# Patient Record
Sex: Female | Born: 1965 | Race: White | Hispanic: No | Marital: Married
Health system: Southern US, Community
[De-identification: ages and names within clinical notes are randomized; demographics above are authoritative.]

## PROBLEM LIST (undated history)

## (undated) DIAGNOSIS — J80 Acute respiratory distress syndrome: Secondary | ICD-10-CM

## (undated) DIAGNOSIS — J9621 Acute and chronic respiratory failure with hypoxia: Secondary | ICD-10-CM

## (undated) DIAGNOSIS — U071 COVID-19: Secondary | ICD-10-CM

## (undated) DIAGNOSIS — Z930 Tracheostomy status: Secondary | ICD-10-CM

---

## 2020-02-02 ENCOUNTER — Inpatient Hospital Stay
Admission: RE | Admit: 2020-02-02 | Discharge: 2020-02-10 | Disposition: A | Discharge status: Home Health | Payer: BC Managed Care – PPO

## 2020-02-02 DIAGNOSIS — U071 COVID-19: Secondary | ICD-10-CM | POA: Diagnosis present

## 2020-02-02 DIAGNOSIS — Z931 Gastrostomy status: Secondary | ICD-10-CM

## 2020-02-02 DIAGNOSIS — Z930 Tracheostomy status: Secondary | ICD-10-CM

## 2020-02-02 DIAGNOSIS — R0602 Shortness of breath: Secondary | ICD-10-CM

## 2020-02-02 DIAGNOSIS — J9621 Acute and chronic respiratory failure with hypoxia: Secondary | ICD-10-CM | POA: Diagnosis present

## 2020-02-02 DIAGNOSIS — J9690 Respiratory failure, unspecified, unspecified whether with hypoxia or hypercapnia: Secondary | ICD-10-CM

## 2020-02-02 HISTORY — DX: Acute and chronic respiratory failure with hypoxia: J96.21

## 2020-02-02 HISTORY — DX: Acute respiratory distress syndrome: J80

## 2020-02-02 HISTORY — DX: COVID-19: U07.1

## 2020-02-02 HISTORY — DX: Tracheostomy status: Z93.0

## 2020-02-03 ENCOUNTER — Other Ambulatory Visit (HOSPITAL_COMMUNITY): Payer: BC Managed Care – PPO

## 2020-02-03 DIAGNOSIS — U071 COVID-19: Secondary | ICD-10-CM

## 2020-02-03 DIAGNOSIS — J9621 Acute and chronic respiratory failure with hypoxia: Secondary | ICD-10-CM

## 2020-02-03 DIAGNOSIS — Z930 Tracheostomy status: Secondary | ICD-10-CM | POA: Diagnosis not present

## 2020-02-03 DIAGNOSIS — J80 Acute respiratory distress syndrome: Secondary | ICD-10-CM

## 2020-02-03 LAB — COMPREHENSIVE METABOLIC PANEL
ALT: 37 U/L (ref 0–44)
AST: 32 U/L (ref 15–41)
Albumin: 2.7 g/dL — ABNORMAL LOW (ref 3.5–5.0)
Alkaline Phosphatase: 66 U/L (ref 38–126)
Anion gap: 9 (ref 5–15)
BUN: <5 mg/dL — ABNORMAL LOW (ref 6–20)
CO2: 28 mmol/L (ref 22–32)
Calcium: 9 mg/dL (ref 8.9–10.3)
Chloride: 103 mmol/L (ref 98–111)
Creatinine, Ser: 0.5 mg/dL (ref 0.44–1.00)
GFR calc Af Amer: >60 mL/min (ref >60)
GFR calc non Af Amer: >60 mL/min (ref >60)
Glucose, Bld: 144 mg/dL — ABNORMAL HIGH (ref 70–99)
Potassium: 3.3 mmol/L — ABNORMAL LOW (ref 3.5–5.1)
Sodium: 140 mmol/L (ref 135–145)
Total Bilirubin: 0.5 mg/dL (ref 0.3–1.2)
Total Protein: 6 g/dL — ABNORMAL LOW (ref 6.5–8.1)

## 2020-02-03 LAB — CBC
HCT: 33.4 % — ABNORMAL LOW (ref 36.0–46.0)
Hemoglobin: 10.4 g/dL — ABNORMAL LOW (ref 12.0–15.0)
MCH: 27.9 pg (ref 26.0–34.0)
MCHC: 31.1 g/dL (ref 30.0–36.0)
MCV: 89.5 fL (ref 80.0–100.0)
Platelets: 324 10*3/uL (ref 150–400)
RBC: 3.73 MIL/uL — ABNORMAL LOW (ref 3.87–5.11)
RDW: 15.4 % (ref 11.5–15.5)
WBC: 8.3 10*3/uL (ref 4.0–10.5)
nRBC: 0 % (ref 0.0–0.2)

## 2020-02-03 LAB — VANCOMYCIN, RANDOM: Vancomycin Rm: 6

## 2020-02-03 NOTE — Consult Note (Signed)
Pulmonary Critical Care Medicine [facility] [place]  PULMONARY SERVICE  Date of Service: 02/03/2020  PULMONARY CRITICAL CARE CONSULT   Desiree Curtis  MPN:[id number]  DOB: [birth year]   DOA: 02/02/2020  Referring Physician: Carron Curie, MD  HPI: Desiree Curtis is a 54 y.o. female seen for follow up of Acute on Chronic Respiratory Failure.  Patient has multiple medical problems including hypertension hyperlipidemia type 2 diabetes GERD admitted to the hospital because of increasing shortness of breath.  Patient has been diagnosed with COVID-19 virus infection and COVID-19 pneumonia.  At the time of presentation patient was febrile and had saturations of 83% on room air.  Also was noted to have an elevated CPK and negative troponin.  Patient was admitted to the hospital for further evaluation.  Patient and decline in status and that is intubated on mechanical ventilation.  Subsequently was not able to come off of the ventilator.patient ended up having to have a tracheostomy.   Review of Systems:  ROS performed and is unremarkable other than noted above.  Past Medical History:  Diagnosis Date  . Depression  . Generalized anxiety disorder  . GERD (gastroesophageal reflux disease)  . Hypercholesteremia  . Hypertension  . Type 2 diabetes mellitus (CMS/HCC) (HCC)  . Vitamin D deficiency   Past medical history has been independently reviewed by me.  Past Surgical History:   Past Surgical History:  Procedure Laterality Date  . CESAREAN SECTION, CLASSIC  . CHOLECYSTECTOMY  . ENDOMETRIAL ABLATION 2006  . KIDNEY STONE SURGERY  . TUBAL LIGATION   Allergies  Allergen Reactions  . Black Dye GI intolerance  . Blue Dye GI intolerance  . Lorcet (Hydrocodone) [Hydrocodone-Acetaminophen] Other (see comments)  . Purple Dye GI intolerance  . Shrimp GI intolerance  . Sulfa (Sulfonamide Antibiotics) GI intolerance  . Red Dye GI intolerance   Allergies are independently  reviewed by me.  Social History:   reports that she has never smoked. She has never used smokeless tobacco. She reports previous alcohol use. She reports that she does not use drugs.  Social history has been independently reviewed by me.  Family History:   family history includes Diabetes in her mother and sister; Heart disease in her mother and sister; Hypertension in her mother and sister; Stroke in her maternal grandmother.     Medications: Reviewed on Rounds  Physical Exam:  Vitals: Temperature is 97.7 pulse 76 respiratory 26 blood pressure is 112/72  Ventilator Settings off the ventilator on T collar room air  . General: Comfortable at this time . Eyes: Grossly normal lids, irises & conjunctiva . ENT: grossly tongue is normal . Neck: no obvious mass . Cardiovascular: S1-S2 normal no gallop or rub . Respiratory: Scattered rhonchi coarse breath sounds . Abdomen: Soft and nontender . Skin: no rash seen on limited exam . Musculoskeletal: not rigid . Psychiatric:unable to assess . Neurologic: no seizure no involuntary movements         Labs on Admission:  Basic Metabolic Panel: Recent Labs  Lab 02/03/20 0754  NA 140  K 3.3*  CL 103  CO2 28  GLUCOSE 144*  BUN <5*  CREATININE 0.50  CALCIUM 9.0    No results for input(s): PHART, PCO2ART, PO2ART, HCO3, O2SAT in the last 168 hours.  Liver Function Tests: Recent Labs  Lab 02/03/20 0754  AST 32  ALT 37  ALKPHOS 66  BILITOT 0.5  PROT 6.0*  ALBUMIN 2.7*   No results for input(s): LIPASE, AMYLASE in the last  168 hours. No results for input(s): AMMONIA in the last 168 hours.  CBC: Recent Labs  Lab 02/03/20 0754  WBC 8.3  HGB 10.4*  HCT 33.4*  MCV 89.5  PLT 324    Cardiac Enzymes: No results for input(s): CKTOTAL, CKMB, CKMBINDEX, TROPONINI in the last 168 hours.  BNP (last 3 results) No results for input(s): BNP in the last 8760 hours.  ProBNP (last 3 results) No results for input(s): PROBNP  in the last 8760 hours.   Radiological Exams on Admission: DG ABDOMEN PEG TUBE LOCATION  Result Date: 02/03/2020 CLINICAL DATA:  54 year old female with respiratory failure, PEG tube. EXAM: ABDOMEN - 1 VIEW COMPARISON:  Portable chest today reported separately. FINDINGS: Portable AP supine view at 0636 hours. Injected contrast outlines the gastric mucosa as expected, as well as the proximal duodenum. Peg tube balloon projects over the stomach in the midline. No abnormal contrast accumulation. Small volume of retained oral contrast also in the large bowel, revealing diverticulosis at the hepatic flexure. Non obstructed bowel gas pattern. Cholecystectomy clips in the right upper quadrant. No acute osseous abnormality identified. CONTRAST:  50 mL of Omnipaque 300 IMPRESSION: 1. Peg tube contrast injection with no adverse features. 2. Small volume of retained contrast in the large bowel, revealing hepatic flexure diverticulosis. Electronically Signed   By: Odessa Fleming M.D.   On: 02/03/2020 07:03   DG CHEST PORT 1 VIEW  Result Date: 02/03/2020 CLINICAL DATA:  Respiratory failure.  Peg tube placement. EXAM: PORTABLE CHEST 1 VIEW COMPARISON:  No prior. FINDINGS: Tracheostomy tube noted good anatomic position. Heart size normal. Low lung volumes with bibasilar atelectasis/infiltrates. No pleural effusion or pneumothorax. No acute bony abnormality. IMPRESSION: 1. Tracheostomy tube noted in good anatomic position. 2. Low lung volumes with bibasilar atelectasis/infiltrates. Electronically Signed   By: Maisie Fus  Register   On: 02/03/2020 07:01    Assessment/Plan Active Problems:   Acute on chronic respiratory failure with hypoxia (HCC)   COVID-19 virus infection   Acute respiratory distress syndrome (ARDS) due to COVID-19 virus (HCC)   Tracheostomy status (HCC)   1. Acute on chronic respiratory failure with hypoxia she is currently on T collar has been on room air doing quite well.  She should be ready for PMV  and capping.  We will continue with advancing however she has had some upper airway issues and edema so therefore ENT might be needed to see to the patient prior to considering decannulation. 2. COVID-19 virus infection has been in recovery phase we will continue to monitor along. 3. ARDS slow improvement we will continue to follow the chest x-ray the current films actually look quite good 4. Tracheostomy status we will need to evaluate the upper airway prior to considering decannulation patient was noted to have laryngeal edema at the other facility.  I have personally seen and evaluated the patient, evaluated laboratory and imaging results, formulated the assessment and plan and placed orders. The Patient requires high complexity decision making with multiple systems involvement.  Case was discussed on Rounds with the Respiratory Therapy Director and the Respiratory staff Time Spent  Yevonne Pax, MD [facility] Pulmonary Critical Care Medicine Sleep Medicine

## 2020-02-04 DIAGNOSIS — R0602 Shortness of breath: Secondary | ICD-10-CM | POA: Diagnosis not present

## 2020-02-04 DIAGNOSIS — Z930 Tracheostomy status: Secondary | ICD-10-CM | POA: Diagnosis not present

## 2020-02-04 DIAGNOSIS — U071 COVID-19: Secondary | ICD-10-CM | POA: Diagnosis not present

## 2020-02-04 DIAGNOSIS — J9621 Acute and chronic respiratory failure with hypoxia: Secondary | ICD-10-CM | POA: Diagnosis not present

## 2020-02-04 NOTE — Progress Notes (Addendum)
Pulmonary Critical Care Medicine [facility] [place]   PULMONARY CRITICAL CARE SERVICE  PROGRESS NOTE  Date of Service: 02/04/2020  Desiree Curtis  QMG:[id number]  DOB: 1966-02-10   DOA: 02/02/2020  Referring Physician: Carron Curie, MD  HPI: Desiree Curtis is a 54 y.o. female seen for follow up of Acute on Chronic Respiratory Failure. Patient remains on 21% aerosol trach collar using PMV satting well this time no distress.  Medications: Reviewed on Rounds  Physical Exam:  Vitals: Pulse 81 respirations 18 BP 135/77 O2 sat 95% temp 98.7  Ventilator Settings ATC 21%  . General: Comfortable at this time . Eyes: Grossly normal lids, irises & conjunctiva . ENT: grossly tongue is normal . Neck: no obvious mass . Cardiovascular: S1 S2 normal no gallop . Respiratory: Coarse breath sounds . Abdomen: soft . Skin: no rash seen on limited exam . Musculoskeletal: not rigid . Psychiatric:unable to assess . Neurologic: no seizure no involuntary movements         Lab Data:   Basic Metabolic Panel: Recent Labs  Lab 02/03/20 0754  NA 140  K 3.3*  CL 103  CO2 28  GLUCOSE 144*  BUN <5*  CREATININE 0.50  CALCIUM 9.0    ABG: No results for input(s): PHART, PCO2ART, PO2ART, HCO3, O2SAT in the last 168 hours.  Liver Function Tests: Recent Labs  Lab 02/03/20 0754  AST 32  ALT 37  ALKPHOS 66  BILITOT 0.5  PROT 6.0*  ALBUMIN 2.7*   No results for input(s): LIPASE, AMYLASE in the last 168 hours. No results for input(s): AMMONIA in the last 168 hours.  CBC: Recent Labs  Lab 02/03/20 0754  WBC 8.3  HGB 10.4*  HCT 33.4*  MCV 89.5  PLT 324    Cardiac Enzymes: No results for input(s): CKTOTAL, CKMB, CKMBINDEX, TROPONINI in the last 168 hours.  BNP (last 3 results) No results for input(s): BNP in the last 8760 hours.  ProBNP (last 3 results) No results for input(s): PROBNP in the last 8760 hours.  Radiological Exams: DG ABDOMEN PEG TUBE  LOCATION  Result Date: 02/03/2020 CLINICAL DATA:  54 year old female with respiratory failure, PEG tube. EXAM: ABDOMEN - 1 VIEW COMPARISON:  Portable chest today reported separately. FINDINGS: Portable AP supine view at 0636 hours. Injected contrast outlines the gastric mucosa as expected, as well as the proximal duodenum. Peg tube balloon projects over the stomach in the midline. No abnormal contrast accumulation. Small volume of retained oral contrast also in the large bowel, revealing diverticulosis at the hepatic flexure. Non obstructed bowel gas pattern. Cholecystectomy clips in the right upper quadrant. No acute osseous abnormality identified. CONTRAST:  50 mL of Omnipaque 300 IMPRESSION: 1. Peg tube contrast injection with no adverse features. 2. Small volume of retained contrast in the large bowel, revealing hepatic flexure diverticulosis. Electronically Signed   By: Odessa Fleming M.D.   On: 02/03/2020 07:03   DG CHEST PORT 1 VIEW  Result Date: 02/03/2020 CLINICAL DATA:  Respiratory failure.  Peg tube placement. EXAM: PORTABLE CHEST 1 VIEW COMPARISON:  No prior. FINDINGS: Tracheostomy tube noted good anatomic position. Heart size normal. Low lung volumes with bibasilar atelectasis/infiltrates. No pleural effusion or pneumothorax. No acute bony abnormality. IMPRESSION: 1. Tracheostomy tube noted in good anatomic position. 2. Low lung volumes with bibasilar atelectasis/infiltrates. Electronically Signed   By: Maisie Fus  Register   On: 02/03/2020 07:01    Assessment/Plan Active Problems:   * No active hospital problems. *   1. Acute  on chronic respiratory failure with hypoxia patient continues on 21% aerosol trach collar will progress to capping as patient can tolerate.  No decannulation at this time until ENT sees. 2. COVID-19 virus infection has been in recovery phase we will continue to monitor along. 3. ARDS slow improvement we will continue to follow the chest x-ray the current films actually look  quite good 4. Tracheostomy status we will need to evaluate the upper airway prior to considering decannulation patient was noted to have laryngeal edema at the other facility.   I have personally seen and evaluated the patient, evaluated laboratory and imaging results, formulated the assessment and plan and placed orders. The Patient requires high complexity decision making with multiple systems involvement.  Rounds were done with the Respiratory Therapy Director and Staff therapists and discussed with nursing staff also.  Yevonne Pax, MD [facility] Pulmonary Critical Care Medicine Sleep Medicine

## 2020-02-05 ENCOUNTER — Other Ambulatory Visit (HOSPITAL_COMMUNITY): Payer: BC Managed Care – PPO

## 2020-02-05 DIAGNOSIS — R0602 Shortness of breath: Secondary | ICD-10-CM | POA: Diagnosis not present

## 2020-02-05 DIAGNOSIS — J9621 Acute and chronic respiratory failure with hypoxia: Secondary | ICD-10-CM | POA: Diagnosis not present

## 2020-02-05 DIAGNOSIS — Z930 Tracheostomy status: Secondary | ICD-10-CM | POA: Diagnosis not present

## 2020-02-05 DIAGNOSIS — U071 COVID-19: Secondary | ICD-10-CM | POA: Diagnosis not present

## 2020-02-05 LAB — BASIC METABOLIC PANEL
Anion gap: 8 (ref 5–15)
BUN: 7 mg/dL (ref 6–20)
CO2: 25 mmol/L (ref 22–32)
Calcium: 8.8 mg/dL — ABNORMAL LOW (ref 8.9–10.3)
Chloride: 106 mmol/L (ref 98–111)
Creatinine, Ser: 0.45 mg/dL (ref 0.44–1.00)
GFR calc Af Amer: >60 mL/min (ref >60)
GFR calc non Af Amer: >60 mL/min (ref >60)
Glucose, Bld: 106 mg/dL — ABNORMAL HIGH (ref 70–99)
Potassium: 3.5 mmol/L (ref 3.5–5.1)
Sodium: 139 mmol/L (ref 135–145)

## 2020-02-05 LAB — VANCOMYCIN, TROUGH: Vancomycin Tr: 10 ug/mL — ABNORMAL LOW (ref 15–20)

## 2020-02-05 NOTE — Progress Notes (Addendum)
Pulmonary Critical Care Medicine [facility] [place]   PULMONARY CRITICAL CARE SERVICE  PROGRESS NOTE  Date of Service: 02/05/2020  Melita Villalona  YHC:[id number]  DOB: Sep 25, 1965   DOA: 02/02/2020  Referring Physician: Carron Curie, MD  HPI: Desiree Curtis is a 54 y.o. female seen for follow up of Acute on Chronic Respiratory Failure.  Patient continues on 28% aerosol trach collar using PMV with no difficulty satting well no distress.  Medications: Reviewed on Rounds  Physical Exam:  Vitals: Pulse 70 respirations 15 BP 130/70 O2 sat 97% temp 98.0  Ventilator Settings 28% ATC  . General: Comfortable at this time . Eyes: Grossly normal lids, irises & conjunctiva . ENT: grossly tongue is normal . Neck: no obvious mass . Cardiovascular: S1 S2 normal no gallop . Respiratory: No rales or rhonchi noted . Abdomen: soft . Skin: no rash seen on limited exam . Musculoskeletal: not rigid . Psychiatric:unable to assess . Neurologic: no seizure no involuntary movements         Lab Data:   Basic Metabolic Panel: Recent Labs  Lab 02/03/20 0754 02/05/20 0505  NA 140 139  K 3.3* 3.5  CL 103 106  CO2 28 25  GLUCOSE 144* 106*  BUN <5* 7  CREATININE 0.50 0.45  CALCIUM 9.0 8.8*    ABG: No results for input(s): PHART, PCO2ART, PO2ART, HCO3, O2SAT in the last 168 hours.  Liver Function Tests: Recent Labs  Lab 02/03/20 0754  AST 32  ALT 37  ALKPHOS 66  BILITOT 0.5  PROT 6.0*  ALBUMIN 2.7*   No results for input(s): LIPASE, AMYLASE in the last 168 hours. No results for input(s): AMMONIA in the last 168 hours.  CBC: Recent Labs  Lab 02/03/20 0754  WBC 8.3  HGB 10.4*  HCT 33.4*  MCV 89.5  PLT 324    Cardiac Enzymes: No results for input(s): CKTOTAL, CKMB, CKMBINDEX, TROPONINI in the last 168 hours.  BNP (last 3 results) No results for input(s): BNP in the last 8760 hours.  ProBNP (last 3 results) No results for input(s): PROBNP in the last 8760  hours.  Radiological Exams: DG Sniff Test  Result Date: 02/05/2020 CLINICAL DATA:  Short of breath.  Post COVID 19 infection. EXAM: CHEST FLUOROSCOPY TECHNIQUE: Real-time fluoroscopic evaluation of the chest was performed. FLUOROSCOPY TIME:  Fluoroscopy Time:  0 minutes 54 second Radiation Exposure Index (if provided by the fluoroscopic device): Number of Acquired Spot Images: 0 COMPARISON:  Chest 02/03/2020 FINDINGS: Elevated right hemidiaphragm. Both diaphragms move appropriately with deep breathing and sniff. No paradoxical moving of the right diaphragm. Diaphragm excursion is approximately equal bilaterally. IMPRESSION: Elevated right hemidiaphragm without diaphragm paresis. Electronically Signed   By: Marlan Palau M.D.   On: 02/05/2020 09:05    Assessment/Plan Active Problems:   * No active hospital problems. *   1. Acute on chronic respiratory failure with hypoxia we will advance to capping trials as patient is doing well on 21% aerosol trach collar continue supportive measures. 2. COVID-19 virus infection has been in recovery phase we will continue to monitor along. 3. ARDS slow improvement we will continue to follow the chest x-ray the current films actually look quite good 4. Tracheostomy status we will need to evaluate the upper airway prior to considering decannulation patient was noted to have laryngeal edema at the other facility.   I have personally seen and evaluated the patient, evaluated laboratory and imaging results, formulated the assessment and plan and placed orders. The  Patient requires high complexity decision making with multiple systems involvement.  Rounds were done with the Respiratory Therapy Director and Staff therapists and discussed with nursing staff also.  Allyne Gee, MD [facility] Pulmonary Critical Care Medicine Sleep Medicine

## 2020-02-06 DIAGNOSIS — Z930 Tracheostomy status: Secondary | ICD-10-CM | POA: Diagnosis not present

## 2020-02-06 DIAGNOSIS — J9621 Acute and chronic respiratory failure with hypoxia: Secondary | ICD-10-CM | POA: Diagnosis present

## 2020-02-06 DIAGNOSIS — J80 Acute respiratory distress syndrome: Secondary | ICD-10-CM | POA: Diagnosis present

## 2020-02-06 DIAGNOSIS — U071 COVID-19: Secondary | ICD-10-CM | POA: Diagnosis present

## 2020-02-06 DIAGNOSIS — R0602 Shortness of breath: Secondary | ICD-10-CM | POA: Diagnosis not present

## 2020-02-06 NOTE — Progress Notes (Addendum)
Pulmonary Critical Care Medicine [facility] [place]   PULMONARY CRITICAL CARE SERVICE  PROGRESS NOTE  Date of Service: 02/06/2020  Desiree Curtis  XHB:[id number]  DOB: 1966-04-04   DOA: 02/02/2020  Referring Physician: Carron Curie, MD  HPI: Desiree Curtis is a 54 y.o. female seen for follow up of Acute on Chronic Respiratory Failure.  Patient On room air has a 24-hour goal at this time currently satting well no fever distress.  Medications: Reviewed on Rounds  Physical Exam:  Vitals: Pulse 74 respirations 20 BP 121/78 O2 sat 95% temp 98.2  Ventilator Settings not currently on ventilator  . General: Comfortable at this time . Eyes: Grossly normal lids, irises & conjunctiva . ENT: grossly tongue is normal . Neck: no obvious mass . Cardiovascular: S1 S2 normal no gallop . Respiratory: No rales or rhonchi noted . Abdomen: soft . Skin: no rash seen on limited exam . Musculoskeletal: not rigid . Psychiatric:unable to assess . Neurologic: no seizure no involuntary movements         Lab Data:   Basic Metabolic Panel: Recent Labs  Lab 02/03/20 0754 02/05/20 0505  NA 140 139  K 3.3* 3.5  CL 103 106  CO2 28 25  GLUCOSE 144* 106*  BUN <5* 7  CREATININE 0.50 0.45  CALCIUM 9.0 8.8*    ABG: No results for input(s): PHART, PCO2ART, PO2ART, HCO3, O2SAT in the last 168 hours.  Liver Function Tests: Recent Labs  Lab 02/03/20 0754  AST 32  ALT 37  ALKPHOS 66  BILITOT 0.5  PROT 6.0*  ALBUMIN 2.7*   No results for input(s): LIPASE, AMYLASE in the last 168 hours. No results for input(s): AMMONIA in the last 168 hours.  CBC: Recent Labs  Lab 02/03/20 0754  WBC 8.3  HGB 10.4*  HCT 33.4*  MCV 89.5  PLT 324    Cardiac Enzymes: No results for input(s): CKTOTAL, CKMB, CKMBINDEX, TROPONINI in the last 168 hours.  BNP (last 3 results) No results for input(s): BNP in the last 8760 hours.  ProBNP (last 3 results) No results for input(s): PROBNP in  the last 8760 hours.  Radiological Exams: DG Sniff Test  Result Date: 02/05/2020 CLINICAL DATA:  Short of breath.  Post COVID 19 infection. EXAM: CHEST FLUOROSCOPY TECHNIQUE: Real-time fluoroscopic evaluation of the chest was performed. FLUOROSCOPY TIME:  Fluoroscopy Time:  0 minutes 54 second Radiation Exposure Index (if provided by the fluoroscopic device): Number of Acquired Spot Images: 0 COMPARISON:  Chest 02/03/2020 FINDINGS: Elevated right hemidiaphragm. Both diaphragms move appropriately with deep breathing and sniff. No paradoxical moving of the right diaphragm. Diaphragm excursion is approximately equal bilaterally. IMPRESSION: Elevated right hemidiaphragm without diaphragm paresis. Electronically Signed   By: Marlan Palau M.D.   On: 02/05/2020 09:05    Assessment/Plan Active Problems:   Acute on chronic respiratory failure with hypoxia (HCC)   COVID-19 virus infection   Acute respiratory distress syndrome (ARDS) due to COVID-19 virus (HCC)   Tracheostomy status (HCC)   1. Acute on chronic respiratory failure with hypoxia patient is doing well capping at this time on room air has a goal of 24 hours.  Patient has had some issues in the past with upper airway will need ENT evaluation for decannulation in the meantime continue supportive measures and aggressive pulmonary toilet. 2. COVID-19 virus infection has been in recovery phase we will continue to monitor along. 3. ARDS slow improvement we will continue to follow the chest x-ray the current films actually  look quite good 4. Tracheostomy status we will need to evaluate the upper airway prior to considering decannulation patient was noted to have laryngeal edema at the other facility.   I have personally seen and evaluated the patient, evaluated laboratory and imaging results, formulated the assessment and plan and placed orders. The Patient requires high complexity decision making with multiple systems involvement.  Rounds were  done with the Respiratory Therapy Director and Staff therapists and discussed with nursing staff also.  Yevonne Pax, MD [facility] Pulmonary Critical Care Medicine Sleep Medicine

## 2020-02-07 DIAGNOSIS — R0602 Shortness of breath: Secondary | ICD-10-CM | POA: Diagnosis not present

## 2020-02-07 DIAGNOSIS — U071 COVID-19: Secondary | ICD-10-CM | POA: Diagnosis not present

## 2020-02-07 DIAGNOSIS — Z930 Tracheostomy status: Secondary | ICD-10-CM | POA: Diagnosis not present

## 2020-02-07 DIAGNOSIS — J9621 Acute and chronic respiratory failure with hypoxia: Secondary | ICD-10-CM | POA: Diagnosis not present

## 2020-02-07 NOTE — Progress Notes (Signed)
Pulmonary Critical Care Medicine [facility] [place]   PULMONARY CRITICAL CARE SERVICE  PROGRESS NOTE  Date of Service: 02/07/2020  Desiree Curtis  LNL:[id number]  DOB: Sep 18, 1965   DOA: 02/02/2020  Referring Physician: Carron Curie, MD  HPI: Desiree Curtis is a 54 y.o. female seen for follow up of Acute on Chronic Respiratory Failure.  Patient is capping doing well today will be 24 hours on capping trials  Medications: Reviewed on Rounds  Physical Exam:  Vitals: Temperature is 97.5 pulse 61 respiratory 18 blood pressure is 137/76 saturations 95%  Ventilator Settings capping trials on 24-hour goal  . General: Comfortable at this time . Eyes: Grossly normal lids, irises & conjunctiva . ENT: grossly tongue is normal . Neck: no obvious mass . Cardiovascular: S1 S2 normal no gallop . Respiratory: No rhonchi very coarse breath sounds . Abdomen: soft . Skin: no rash seen on limited exam . Musculoskeletal: not rigid . Psychiatric:unable to assess . Neurologic: no seizure no involuntary movements         Lab Data:   Basic Metabolic Panel: Recent Labs  Lab 02/03/20 0754 02/05/20 0505  NA 140 139  K 3.3* 3.5  CL 103 106  CO2 28 25  GLUCOSE 144* 106*  BUN <5* 7  CREATININE 0.50 0.45  CALCIUM 9.0 8.8*    ABG: No results for input(s): PHART, PCO2ART, PO2ART, HCO3, O2SAT in the last 168 hours.  Liver Function Tests: Recent Labs  Lab 02/03/20 0754  AST 32  ALT 37  ALKPHOS 66  BILITOT 0.5  PROT 6.0*  ALBUMIN 2.7*   No results for input(s): LIPASE, AMYLASE in the last 168 hours. No results for input(s): AMMONIA in the last 168 hours.  CBC: Recent Labs  Lab 02/03/20 0754  WBC 8.3  HGB 10.4*  HCT 33.4*  MCV 89.5  PLT 324    Cardiac Enzymes: No results for input(s): CKTOTAL, CKMB, CKMBINDEX, TROPONINI in the last 168 hours.  BNP (last 3 results) No results for input(s): BNP in the last 8760 hours.  ProBNP (last 3 results) No results for  input(s): PROBNP in the last 8760 hours.  Radiological Exams: No results found.  Assessment/Plan Active Problems:   Acute on chronic respiratory failure with hypoxia (HCC)   COVID-19 virus infection   Acute respiratory distress syndrome (ARDS) due to COVID-19 virus (HCC)   Tracheostomy status (HCC)   1. Acute on chronic respiratory failure with hypoxia we will continue with capping trials 2. COVID-19 virus infection in resolution phase 3. ARDS slow improvement 4. Tracheostomy doing well with capping   I have personally seen and evaluated the patient, evaluated laboratory and imaging results, formulated the assessment and plan and placed orders. The Patient requires high complexity decision making with multiple systems involvement.  Rounds were done with the Respiratory Therapy Director and Staff therapists and discussed with nursing staff also.  Yevonne Pax, MD [facility] Pulmonary Critical Care Medicine Sleep Medicine

## 2020-02-08 DIAGNOSIS — U071 COVID-19: Secondary | ICD-10-CM | POA: Diagnosis not present

## 2020-02-08 DIAGNOSIS — R0602 Shortness of breath: Secondary | ICD-10-CM | POA: Diagnosis not present

## 2020-02-08 DIAGNOSIS — Z930 Tracheostomy status: Secondary | ICD-10-CM | POA: Diagnosis not present

## 2020-02-08 DIAGNOSIS — J9621 Acute and chronic respiratory failure with hypoxia: Secondary | ICD-10-CM | POA: Diagnosis not present

## 2020-02-08 LAB — VANCOMYCIN, TROUGH: Vancomycin Tr: 14 ug/mL — ABNORMAL LOW (ref 15–20)

## 2020-02-08 NOTE — Progress Notes (Signed)
Pulmonary Critical Care Medicine [facility] [place]   PULMONARY CRITICAL CARE SERVICE  PROGRESS NOTE  Date of Service: 02/08/2020  Desiree Curtis  GYI:[id number]  DOB: 1965-10-04   DOA: 02/02/2020  Referring Physician: Carron Curie, MD  HPI: Desiree Curtis is a 54 y.o. female seen for follow up of Acute on Chronic Respiratory Failure.  Patient currently is doing well with the capping ready for decannulation  Medications: Reviewed on Rounds  Physical Exam:  Vitals: Temperature 97.0 pulse 74 respiratory 20 blood pressure is 105/60 saturations 97%  Ventilator Settings capping  . General: Comfortable at this time . Eyes: Grossly normal lids, irises & conjunctiva . ENT: grossly tongue is normal . Neck: no obvious mass . Cardiovascular: S1 S2 normal no gallop . Respiratory: No rhonchi no rales . Abdomen: soft . Skin: no rash seen on limited exam . Musculoskeletal: not rigid . Psychiatric:unable to assess . Neurologic: no seizure no involuntary movements         Lab Data:   Basic Metabolic Panel: Recent Labs  Lab 02/03/20 0754 02/05/20 0505  NA 140 139  K 3.3* 3.5  CL 103 106  CO2 28 25  GLUCOSE 144* 106*  BUN <5* 7  CREATININE 0.50 0.45  CALCIUM 9.0 8.8*    ABG: No results for input(s): PHART, PCO2ART, PO2ART, HCO3, O2SAT in the last 168 hours.  Liver Function Tests: Recent Labs  Lab 02/03/20 0754  AST 32  ALT 37  ALKPHOS 66  BILITOT 0.5  PROT 6.0*  ALBUMIN 2.7*   No results for input(s): LIPASE, AMYLASE in the last 168 hours. No results for input(s): AMMONIA in the last 168 hours.  CBC: Recent Labs  Lab 02/03/20 0754  WBC 8.3  HGB 10.4*  HCT 33.4*  MCV 89.5  PLT 324    Cardiac Enzymes: No results for input(s): CKTOTAL, CKMB, CKMBINDEX, TROPONINI in the last 168 hours.  BNP (last 3 results) No results for input(s): BNP in the last 8760 hours.  ProBNP (last 3 results) No results for input(s): PROBNP in the last 8760  hours.  Radiological Exams: No results found.  Assessment/Plan Active Problems:   Acute on chronic respiratory failure with hypoxia (HCC)   COVID-19 virus infection   Acute respiratory distress syndrome (ARDS) due to COVID-19 virus (HCC)   Tracheostomy status (HCC)   1. Acute on chronic respiratory failure hypoxia we will proceed to decannulate 2. COVID-19 virus infection in recovery 3. ARDS slow improvement we will continue to follow 4. Tracheostomy will remain in place   I have personally seen and evaluated the patient, evaluated laboratory and imaging results, formulated the assessment and plan and placed orders. The Patient requires high complexity decision making with multiple systems involvement.  Rounds were done with the Respiratory Therapy Director and Staff therapists and discussed with nursing staff also.  Yevonne Pax, MD [facility] Pulmonary Critical Care Medicine Sleep Medicine

## 2020-02-09 LAB — BASIC METABOLIC PANEL
Anion gap: 12 (ref 5–15)
BUN: 8 mg/dL (ref 6–20)
CO2: 22 mmol/L (ref 22–32)
Calcium: 8.4 mg/dL — ABNORMAL LOW (ref 8.9–10.3)
Chloride: 105 mmol/L (ref 98–111)
Creatinine, Ser: 0.6 mg/dL (ref 0.44–1.00)
GFR calc Af Amer: >60 mL/min (ref >60)
GFR calc non Af Amer: >60 mL/min (ref >60)
Glucose, Bld: 147 mg/dL — ABNORMAL HIGH (ref 70–99)
Potassium: 2.7 mmol/L — CL (ref 3.5–5.1)
Sodium: 139 mmol/L (ref 135–145)

## 2020-02-09 LAB — CBC
HCT: 33.6 % — ABNORMAL LOW (ref 36.0–46.0)
Hemoglobin: 10.3 g/dL — ABNORMAL LOW (ref 12.0–15.0)
MCH: 27.1 pg (ref 26.0–34.0)
MCHC: 30.7 g/dL (ref 30.0–36.0)
MCV: 88.4 fL (ref 80.0–100.0)
Platelets: 279 10*3/uL (ref 150–400)
RBC: 3.8 MIL/uL — ABNORMAL LOW (ref 3.87–5.11)
RDW: 15.9 % — ABNORMAL HIGH (ref 11.5–15.5)
WBC: 8.4 10*3/uL (ref 4.0–10.5)
nRBC: 0 % (ref 0.0–0.2)

## 2020-02-10 ENCOUNTER — Other Ambulatory Visit (HOSPITAL_COMMUNITY): Payer: BC Managed Care – PPO

## 2020-02-10 LAB — POTASSIUM: Potassium: 4.1 mmol/L (ref 3.5–5.1)

## 2020-02-10 LAB — BASIC METABOLIC PANEL
Anion gap: 12 (ref 5–15)
BUN: 8 mg/dL (ref 6–20)
CO2: 23 mmol/L (ref 22–32)
Calcium: 9.2 mg/dL (ref 8.9–10.3)
Chloride: 102 mmol/L (ref 98–111)
Creatinine, Ser: 0.56 mg/dL (ref 0.44–1.00)
GFR calc non Af Amer: >60 mL/min (ref >60)
Glucose, Bld: 153 mg/dL — ABNORMAL HIGH (ref 70–99)
Potassium: 2.9 mmol/L — ABNORMAL LOW (ref 3.5–5.1)
Sodium: 137 mmol/L (ref 135–145)

## 2021-01-10 IMAGING — RF DG SNIFF TEST
3 series · 12 of 12 positions shown · non-contrast
Comparison: Chest 02/03/2020

CLINICAL DATA: Short of breath.  Post COVID 19 infection.

EXAM:
CHEST FLUOROSCOPY
TECHNIQUE: Real-time fluoroscopic evaluation of the chest was performed.
FLUOROSCOPY TIME:  Fluoroscopy Time:  0 minutes 54 second
Radiation Exposure Index (if provided by the fluoroscopic device):
Number of Acquired Spot Images: 0

[Series 1: cp_chest · 0.55mm/px · 4 of 174 frames shown (1 of 3)]
[frame 10/174]
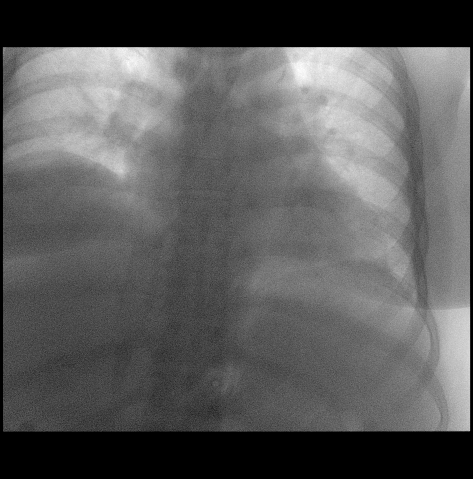
[frame 27/174]
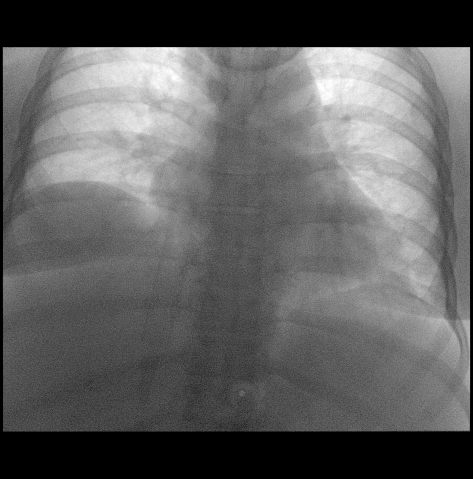
[frame 88/174]
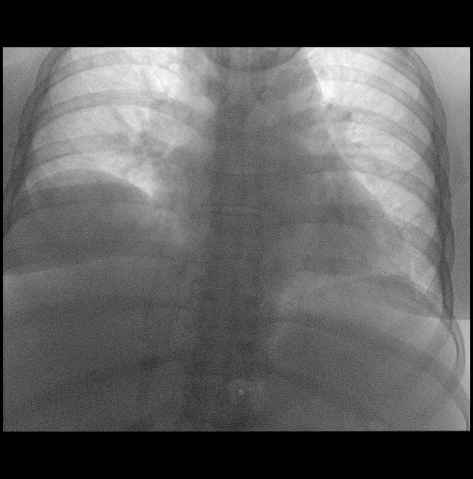
[frame 148/174]
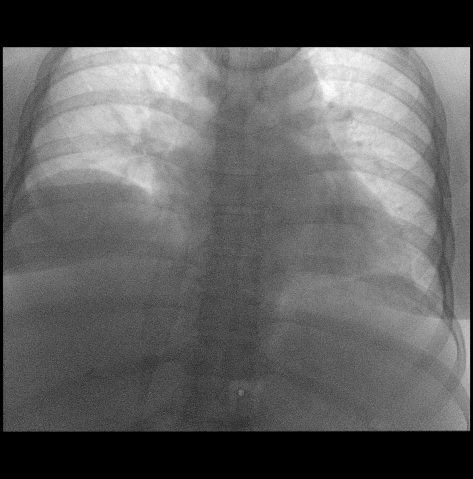

[Series 2: cp_chest · 0.54mm/px · 4 of 159 frames shown (2 of 3)]
[frame 13/159]
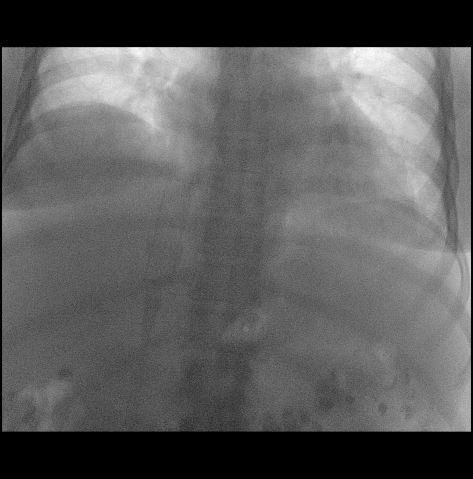
[frame 24/159]
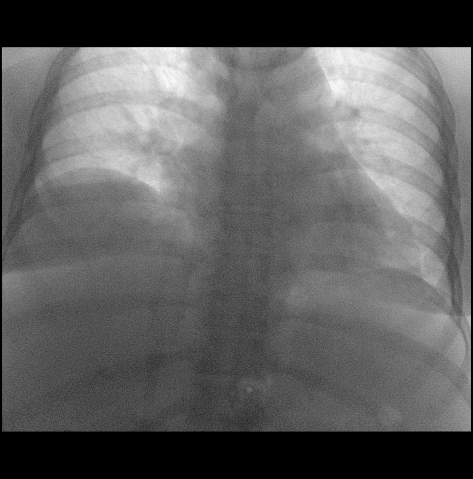
[frame 80/159]
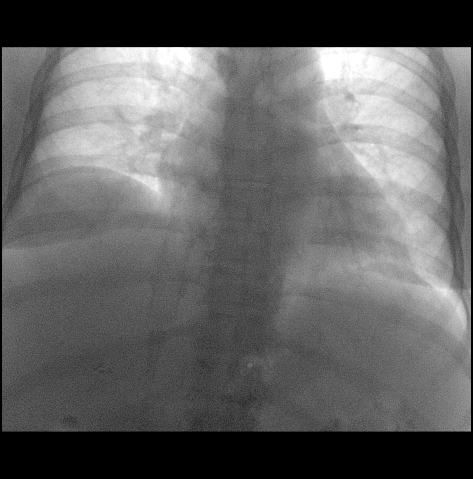
[frame 136/159]
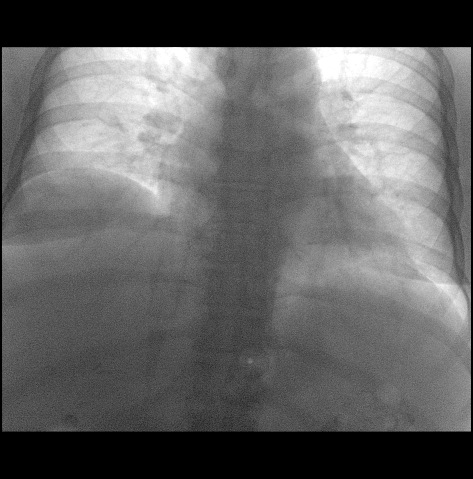

[Series 3: cp_chest · 0.56mm/px · 4 of 123 frames shown (3 of 3)]
[frame 19/123]
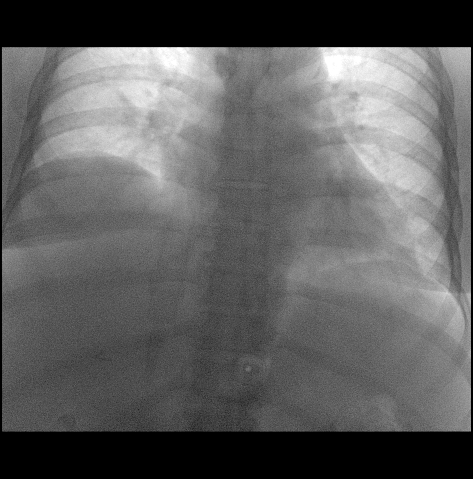
[frame 62/123]
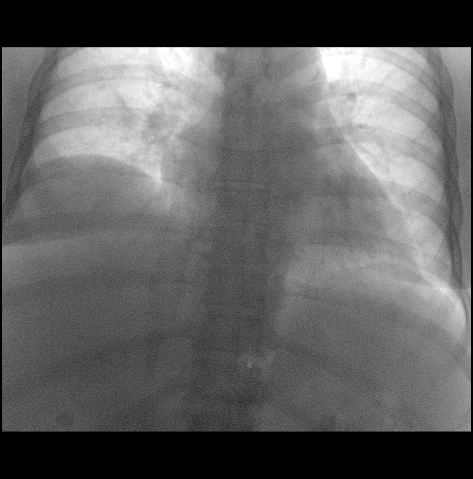
[frame 81/123]
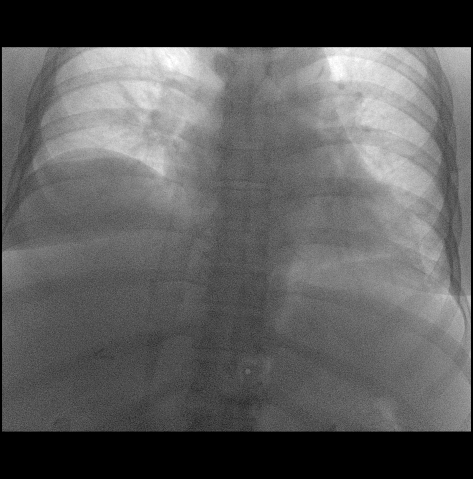
[frame 105/123]
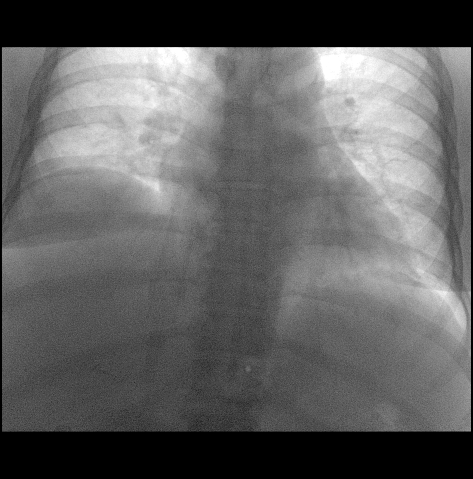

[12 of 12 positions shown; findings below may reference images not displayed]

FINDINGS: Elevated right hemidiaphragm. Both diaphragms move appropriately
with deep breathing and sniff. No paradoxical moving of the right
diaphragm. Diaphragm excursion is approximately equal bilaterally.
IMPRESSION: Elevated right hemidiaphragm without diaphragm paresis.
